# Patient Record
Sex: Male | Born: 2000
Health system: Southern US, Community
[De-identification: ages and names within clinical notes are randomized; demographics above are authoritative.]

## PROBLEM LIST (undated history)

## (undated) DIAGNOSIS — J45909 Unspecified asthma, uncomplicated: Secondary | ICD-10-CM

## (undated) DIAGNOSIS — N186 End stage renal disease: Secondary | ICD-10-CM

## (undated) DIAGNOSIS — N049 Nephrotic syndrome with unspecified morphologic changes: Secondary | ICD-10-CM

## (undated) DIAGNOSIS — N289 Disorder of kidney and ureter, unspecified: Secondary | ICD-10-CM

## (undated) HISTORY — PX: RENAL BIOPSY: SHX156

---
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
## 2019-01-19 MED ORDER — GENERIC EXTERNAL MEDICATION
60.00 | Status: DC
Start: 2019-01-20 — End: 2019-01-19

## 2019-08-20 MED ORDER — MYCOPHENOLATE MOFETIL 250 MG PO CAPS
1000.00 | ORAL_CAPSULE | ORAL | Status: DC
Start: 2019-08-20 — End: 2019-08-20

## 2019-08-20 MED ORDER — CHOLECALCIFEROL 25 MCG (1000 UT) PO TABS
4000.00 | ORAL_TABLET | ORAL | Status: DC
Start: 2019-08-21 — End: 2019-08-20

## 2019-08-20 MED ORDER — AMLODIPINE BESYLATE 5 MG PO TABS
5.00 | ORAL_TABLET | ORAL | Status: DC
Start: 2019-08-21 — End: 2019-08-20

## 2019-08-20 MED ORDER — ISRADIPINE 2.5 MG PO CAPS
2.50 | ORAL_CAPSULE | ORAL | Status: DC
Start: ? — End: 2019-08-20

## 2019-08-20 MED ORDER — PREDNISONE 20 MG PO TABS
60.00 | ORAL_TABLET | ORAL | Status: DC
Start: 2019-08-21 — End: 2019-08-20

## 2020-01-25 MED ORDER — FAMOTIDINE 20 MG PO TABS
20.00 | ORAL_TABLET | ORAL | Status: DC
Start: 2020-01-25 — End: 2020-01-25

## 2020-01-25 MED ORDER — ERGOCALCIFEROL 1.25 MG (50000 UT) PO CAPS
50000.00 | ORAL_CAPSULE | ORAL | Status: DC
Start: 2020-01-28 — End: 2020-01-25

## 2020-01-25 MED ORDER — PREDNISONE 20 MG PO TABS
60.00 | ORAL_TABLET | ORAL | Status: DC
Start: 2020-01-26 — End: 2020-01-25

## 2020-01-25 MED ORDER — MYCOPHENOLATE MOFETIL 250 MG PO CAPS
1000.00 | ORAL_CAPSULE | ORAL | Status: DC
Start: 2020-01-25 — End: 2020-01-25

## 2020-05-24 ENCOUNTER — Encounter: Payer: Self-pay | Admitting: Pediatrics

## 2020-08-21 ENCOUNTER — Ambulatory Visit: Admit: 2020-08-21 | Discharge status: Against Medical Advice | Payer: Self-pay

## 2020-10-27 ENCOUNTER — Encounter (HOSPITAL_COMMUNITY): Payer: Self-pay

## 2020-10-27 ENCOUNTER — Emergency Department (HOSPITAL_COMMUNITY)
Admission: EM | Admit: 2020-10-27 | Discharge: 2020-10-27 | Disposition: A | Discharge status: Home / Self Care | Payer: Medicaid Other | Attending: Emergency Medicine | Admitting: Emergency Medicine

## 2020-10-27 ENCOUNTER — Other Ambulatory Visit: Payer: Self-pay

## 2020-10-27 DIAGNOSIS — H66002 Acute suppurative otitis media without spontaneous rupture of ear drum, left ear: Secondary | ICD-10-CM | POA: Diagnosis not present

## 2020-10-27 DIAGNOSIS — Z7722 Contact with and (suspected) exposure to environmental tobacco smoke (acute) (chronic): Secondary | ICD-10-CM | POA: Diagnosis not present

## 2020-10-27 DIAGNOSIS — Z79899 Other long term (current) drug therapy: Secondary | ICD-10-CM | POA: Insufficient documentation

## 2020-10-27 DIAGNOSIS — J45909 Unspecified asthma, uncomplicated: Secondary | ICD-10-CM | POA: Insufficient documentation

## 2020-10-27 DIAGNOSIS — R0981 Nasal congestion: Secondary | ICD-10-CM | POA: Insufficient documentation

## 2020-10-27 DIAGNOSIS — H9201 Otalgia, right ear: Secondary | ICD-10-CM | POA: Diagnosis present

## 2020-10-27 MED ORDER — AMOXICILLIN 500 MG PO CAPS
500.0000 mg | ORAL_CAPSULE | Freq: Once | ORAL | Status: AC
  Administered 2020-10-27: 500 mg via ORAL
  Filled 2020-10-27: qty 1

## 2020-10-27 MED ORDER — AMOXICILLIN 500 MG PO CAPS: 500.0000 mg | ORAL_CAPSULE | Freq: Three times a day (TID) | ORAL | 0 refills | Status: DC

## 2020-10-27 NOTE — ED Triage Notes (Signed)
Pt reports left ear pain that radiates to jaw that started 2 days ago. Pt also reports nasal congestion.
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
## 2022-10-17 ENCOUNTER — Emergency Department (HOSPITAL_COMMUNITY): Payer: Medicaid Other

## 2022-10-17 ENCOUNTER — Emergency Department (HOSPITAL_COMMUNITY)
Admission: EM | Admit: 2022-10-17 | Discharge: 2022-10-17 | Disposition: A | Discharge status: Home / Self Care | Payer: Medicaid Other | Attending: Emergency Medicine | Admitting: Emergency Medicine

## 2022-10-17 ENCOUNTER — Other Ambulatory Visit: Payer: Self-pay

## 2022-10-17 ENCOUNTER — Encounter (HOSPITAL_COMMUNITY): Payer: Self-pay

## 2022-10-17 DIAGNOSIS — S79922A Unspecified injury of left thigh, initial encounter: Secondary | ICD-10-CM | POA: Diagnosis present

## 2022-10-17 DIAGNOSIS — D72829 Elevated white blood cell count, unspecified: Secondary | ICD-10-CM | POA: Insufficient documentation

## 2022-10-17 DIAGNOSIS — S71102A Unspecified open wound, left thigh, initial encounter: Secondary | ICD-10-CM | POA: Diagnosis not present

## 2022-10-17 DIAGNOSIS — W3400XA Accidental discharge from unspecified firearms or gun, initial encounter: Secondary | ICD-10-CM | POA: Diagnosis not present

## 2022-10-17 LAB — CBC WITH DIFFERENTIAL/PLATELET
Abs Immature Granulocytes: 0.04 10*3/uL (ref 0.00–0.07)
Basophils Absolute: 0.1 10*3/uL (ref 0.0–0.1)
Basophils Relative: 1 %
Eosinophils Absolute: 0.8 10*3/uL — ABNORMAL HIGH (ref 0.0–0.5)
Eosinophils Relative: 6 %
HCT: 35.5 % — ABNORMAL LOW (ref 39.0–52.0)
Hemoglobin: 11.7 g/dL — ABNORMAL LOW (ref 13.0–17.0)
Immature Granulocytes: 0 %
Lymphocytes Relative: 13 %
Lymphs Abs: 1.7 10*3/uL (ref 0.7–4.0)
MCH: 29.6 pg (ref 26.0–34.0)
MCHC: 33 g/dL (ref 30.0–36.0)
MCV: 89.9 fL (ref 80.0–100.0)
Monocytes Absolute: 0.9 10*3/uL (ref 0.1–1.0)
Monocytes Relative: 7 %
Neutro Abs: 9.2 10*3/uL — ABNORMAL HIGH (ref 1.7–7.7)
Neutrophils Relative %: 73 %
Platelets: 272 10*3/uL (ref 150–400)
RBC: 3.95 MIL/uL — ABNORMAL LOW (ref 4.22–5.81)
RDW: 13.2 % (ref 11.5–15.5)
WBC: 12.8 10*3/uL — ABNORMAL HIGH (ref 4.0–10.5)
nRBC: 0 % (ref 0.0–0.2)

## 2022-10-17 LAB — BASIC METABOLIC PANEL
Anion gap: 5 (ref 5–15)
BUN: 18 mg/dL (ref 6–20)
CO2: 23 mmol/L (ref 22–32)
Calcium: 7.8 mg/dL — ABNORMAL LOW (ref 8.9–10.3)
Chloride: 114 mmol/L — ABNORMAL HIGH (ref 98–111)
Creatinine, Ser: 2.22 mg/dL — ABNORMAL HIGH (ref 0.61–1.24)
GFR, Estimated: 42 mL/min — ABNORMAL LOW (ref >60)
Glucose, Bld: 92 mg/dL (ref 70–99)
Potassium: 3.9 mmol/L (ref 3.5–5.1)
Sodium: 142 mmol/L (ref 135–145)

## 2022-10-17 MED ORDER — OXYCODONE-ACETAMINOPHEN 5-325 MG PO TABS: 1.0000 | ORAL_TABLET | Freq: Four times a day (QID) | ORAL | 0 refills | Status: AC | PRN

## 2022-10-17 MED ORDER — CEPHALEXIN 500 MG PO CAPS: 500.0000 mg | ORAL_CAPSULE | Freq: Two times a day (BID) | ORAL | 0 refills | Status: DC

## 2022-10-17 MED ORDER — OXYCODONE-ACETAMINOPHEN 5-325 MG PO TABS
1.0000 | ORAL_TABLET | Freq: Once | ORAL | Status: AC
  Administered 2022-10-17: 1 via ORAL
  Filled 2022-10-17: qty 1

## 2022-10-17 MED ORDER — CEFAZOLIN SODIUM-DEXTROSE 1-4 GM/50ML-% IV SOLN
1.0000 g | Freq: Once | INTRAVENOUS | Status: AC
  Administered 2022-10-17: 1 g via INTRAVENOUS
  Filled 2022-10-17: qty 50
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
## 2022-10-17 NOTE — Progress Notes (Signed)
Orthopedic Tech Progress Note Patient Details:  [name] [birth year] [id number]  Patient ID: [name], male   DOB: 2001-05-27, 21 y.o.   MRN: [id number] Viewing chart regarding ortho tech orders. [name] 10/17/2022, 10:31 PM
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
## 2023-08-24 ENCOUNTER — Other Ambulatory Visit (HOSPITAL_COMMUNITY): Payer: Self-pay | Admitting: *Deleted

## 2023-08-24 DIAGNOSIS — D649 Anemia, unspecified: Secondary | ICD-10-CM

## 2023-08-28 ENCOUNTER — Inpatient Hospital Stay (HOSPITAL_COMMUNITY): Admission: RE | Admit: 2023-08-28 | Payer: Medicaid Other | Source: Ambulatory Visit

## 2023-09-06 ENCOUNTER — Inpatient Hospital Stay (HOSPITAL_COMMUNITY): Admission: RE | Admit: 2023-09-06 | Payer: Self-pay | Source: Ambulatory Visit

## 2023-10-26 ENCOUNTER — Other Ambulatory Visit: Payer: Self-pay | Admitting: *Deleted

## 2023-10-26 DIAGNOSIS — N186 End stage renal disease: Secondary | ICD-10-CM

## 2023-11-01 ENCOUNTER — Ambulatory Visit (HOSPITAL_COMMUNITY)
Admission: RE | Admit: 2023-11-01 | Discharge: 2023-11-01 | Disposition: A | Discharge status: Home / Self Care | Payer: 59 | Source: Ambulatory Visit | Attending: Vascular Surgery | Admitting: Vascular Surgery

## 2023-11-01 ENCOUNTER — Ambulatory Visit (INDEPENDENT_AMBULATORY_CARE_PROVIDER_SITE_OTHER)
Admission: RE | Admit: 2023-11-01 | Discharge: 2023-11-01 | Disposition: A | Discharge status: Home / Self Care | Payer: Self-pay | Source: Ambulatory Visit | Attending: Vascular Surgery | Admitting: Vascular Surgery

## 2023-11-01 DIAGNOSIS — N186 End stage renal disease: Secondary | ICD-10-CM | POA: Insufficient documentation
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
## 2023-11-06 ENCOUNTER — Encounter: Payer: Self-pay | Admitting: Vascular Surgery
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
## 2023-12-13 ENCOUNTER — Encounter: Payer: Self-pay | Admitting: Vascular Surgery

## 2024-01-04 ENCOUNTER — Other Ambulatory Visit: Payer: Self-pay | Admitting: *Deleted

## 2024-01-04 DIAGNOSIS — N186 End stage renal disease: Secondary | ICD-10-CM

## 2024-01-10 ENCOUNTER — Ambulatory Visit (HOSPITAL_COMMUNITY)
Admission: RE | Admit: 2024-01-10 | Discharge: 2024-01-10 | Disposition: A | Discharge status: Home / Self Care | Payer: Self-pay | Source: Ambulatory Visit | Attending: Vascular Surgery | Admitting: Vascular Surgery

## 2024-01-10 ENCOUNTER — Encounter (HOSPITAL_COMMUNITY): Payer: Self-pay

## 2024-01-10 ENCOUNTER — Ambulatory Visit (HOSPITAL_COMMUNITY)
Admission: RE | Admit: 2024-01-10 | Discharge: 2024-01-10 | Disposition: A | Discharge status: Home / Self Care | Payer: Medicaid Other | Source: Ambulatory Visit | Attending: Vascular Surgery | Admitting: Vascular Surgery

## 2024-01-10 DIAGNOSIS — N186 End stage renal disease: Secondary | ICD-10-CM
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
## 2024-01-24 ENCOUNTER — Encounter: Payer: Self-pay | Admitting: Vascular Surgery

## 2024-01-24 ENCOUNTER — Ambulatory Visit: Payer: Medicaid Other | Admitting: Vascular Surgery

## 2024-01-24 VITALS — BP 151/103 | Temp 98.8°F | Resp 20 | Ht 72.0 in | Wt 136.0 lb

## 2024-01-24 DIAGNOSIS — Z992 Dependence on renal dialysis: Secondary | ICD-10-CM | POA: Diagnosis not present

## 2024-01-24 DIAGNOSIS — N186 End stage renal disease: Secondary | ICD-10-CM | POA: Diagnosis not present

## 2024-04-26 ENCOUNTER — Encounter (HOSPITAL_COMMUNITY): Payer: Self-pay

## 2024-04-26 ENCOUNTER — Other Ambulatory Visit: Payer: Self-pay

## 2024-04-26 ENCOUNTER — Emergency Department (HOSPITAL_COMMUNITY)
Admission: EM | Admit: 2024-04-26 | Discharge: 2024-04-26 | Disposition: A | Discharge status: Home / Self Care | Attending: Emergency Medicine | Admitting: Emergency Medicine

## 2024-04-26 DIAGNOSIS — N186 End stage renal disease: Secondary | ICD-10-CM | POA: Diagnosis present

## 2024-04-26 DIAGNOSIS — Z992 Dependence on renal dialysis: Secondary | ICD-10-CM | POA: Diagnosis not present

## 2024-04-26 HISTORY — DX: End stage renal disease: N18.6

## 2024-04-26 LAB — BASIC METABOLIC PANEL WITH GFR
Anion gap: 15 (ref 5–15)
BUN: 43 mg/dL — ABNORMAL HIGH (ref 6–20)
CO2: 22 mmol/L (ref 22–32)
Calcium: 8.9 mg/dL (ref 8.9–10.3)
Chloride: 102 mmol/L (ref 98–111)
Creatinine, Ser: 15.47 mg/dL — ABNORMAL HIGH (ref 0.61–1.24)
GFR, Estimated: 4 mL/min — ABNORMAL LOW (ref >60)
Glucose, Bld: 74 mg/dL (ref 70–99)
Potassium: 4.4 mmol/L (ref 3.5–5.1)
Sodium: 139 mmol/L (ref 135–145)

## 2024-04-26 LAB — CBC
HCT: 36.3 % — ABNORMAL LOW (ref 39.0–52.0)
Hemoglobin: 12.5 g/dL — ABNORMAL LOW (ref 13.0–17.0)
MCH: 31.4 pg (ref 26.0–34.0)
MCHC: 34.4 g/dL (ref 30.0–36.0)
MCV: 91.2 fL (ref 80.0–100.0)
Platelets: 169 10*3/uL (ref 150–400)
RBC: 3.98 MIL/uL — ABNORMAL LOW (ref 4.22–5.81)
RDW: 14.6 % (ref 11.5–15.5)
WBC: 4.5 10*3/uL (ref 4.0–10.5)
nRBC: 0 % (ref 0.0–0.2)

## 2024-04-26 LAB — HEPATITIS B SURFACE ANTIGEN: Hepatitis B Surface Ag: NONREACTIVE

## 2024-04-26 MED ORDER — ALTEPLASE 2 MG IJ SOLR: 2.0000 mg | Freq: Once | INTRAMUSCULAR | Status: DC | PRN

## 2024-04-26 MED ORDER — PENTAFLUOROPROP-TETRAFLUOROETH EX AERO: 1.0000 | INHALATION_SPRAY | CUTANEOUS | Status: DC | PRN

## 2024-04-26 MED ORDER — HEPARIN SODIUM (PORCINE) 1000 UNIT/ML DIALYSIS: 2000.0000 [IU] | INTRAMUSCULAR | Status: DC | PRN

## 2024-04-26 MED ORDER — CHLORHEXIDINE GLUCONATE CLOTH 2 % EX PADS: 6.0000 | MEDICATED_PAD | Freq: Every day | CUTANEOUS | Status: DC

## 2024-04-26 MED ORDER — NEPRO/CARBSTEADY PO LIQD
237.0000 mL | ORAL | Status: DC | PRN
  Filled 2024-04-26: qty 237

## 2024-04-26 MED ORDER — ANTICOAGULANT SODIUM CITRATE 4% (200MG/5ML) IV SOLN
5.0000 mL | Status: DC | PRN
  Filled 2024-04-26: qty 5

## 2024-04-26 MED ORDER — LIDOCAINE HCL (PF) 1 % IJ SOLN: 5.0000 mL | INTRAMUSCULAR | Status: DC | PRN

## 2024-04-26 MED ORDER — LIDOCAINE-PRILOCAINE 2.5-2.5 % EX CREA: 1.0000 | TOPICAL_CREAM | CUTANEOUS | Status: DC | PRN

## 2024-04-26 MED ORDER — HEPARIN SODIUM (PORCINE) 1000 UNIT/ML DIALYSIS: 1000.0000 [IU] | INTRAMUSCULAR | Status: DC | PRN

## 2024-04-26 MED ORDER — HEPARIN SODIUM (PORCINE) 1000 UNIT/ML DIALYSIS: 2000.0000 [IU] | Freq: Once | INTRAMUSCULAR | Status: DC

## 2024-04-26 NOTE — Progress Notes (Signed)
 Asked to see this patient for hospital dialysis.  He started dialysis in the outpatient setting about 1 month ago at [facility] HD clinic on TTS schedule.  He is now incarcerated and needs hospital dialysis.  He had dialysis on 5/08, 2 days ago, at his usual dialysis clinic.  He does not know the cause of his kidney failure.  They usually pull from 2.5 to 3 L during each session.  He denies any shortness of breath, cough or chest pain.  Has a tunneled catheter in his right upper chest.  Exam is otherwise unremarkable, no lower extremity edema.  OP HD orders from 04/26/24 --> 4h  B400  56.2kg   2K bath  RIJ TDC  Heparin 2000  The plan will be for "ED HD". Pt is not to be admitted at this time. Pt will go to the dialysis unit when they are ready for the patient. When dialysis is completed pt will be sent back to ED for reassessment.   Larry Poag  MD  [facility] 04/26/2024, 11:04 AM  Recent Labs  Lab 04/26/24 1027  HGB 12.5*    Inpatient medications:

## 2024-04-26 NOTE — ED Provider Notes (Signed)
 [place] EMERGENCY DEPARTMENT AT [facility] Provider Note   CSN: [id number] Arrival date & time: 04/26/24  0957     History  Chief Complaint  Patient presents with   Needs dialysis    [name] is a 23 y.o. male history of ESRD on dialysis presented for his Monday Wednesday Saturday dialysis.  Patient has no acute concerns at this time and denies chest pain shortness of breath feeling weak or confusion.  Home Medications Prior to Admission medications   Medication Sig Start Date End Date Taking? Authorizing Provider  albuterol  (PROVENTIL  HFA;VENTOLIN  HFA) 108 (90 BASE) MCG/ACT inhaler Inhale 2 puffs into the lungs every 4 (four) hours as needed for wheezing (use with home spacer). 11/08/15   Lavonda Pour, MD  allopurinol  (ZYLOPRIM ) 100 MG tablet Take 100 mg by mouth daily.    [provider]  amLODipine  (NORVASC ) 5 MG tablet Take 1 tablet (5 mg total) by mouth daily. 08/10/16   Georganna Kin, PA-C  calcium  carbonate (OS-CAL - DOSED IN MG OF ELEMENTAL CALCIUM ) 1250 (500 Ca) MG tablet Take 1 tablet (500 mg of elemental calcium  total) by mouth 2 (two) times daily with a meal. 08/10/16   Georganna Kin, PA-C  Cholecalciferol  (D 2000) 2000 UNITS TABS Take 4,000 Units by mouth daily. 09/07/13   [provider]  cycloSPORINE  modified (NEORAL ) 100 MG capsule Take 1 capsule (100 mg total) twice a day for a total of 150mg  in the morning and 125mg  at night. 10/21/15   Lavonda Pour, MD  cycloSPORINE  modified (NEORAL ) 100 MG capsule Take 100 mg by mouth at bedtime.    [provider]  cycloSPORINE  modified (NEORAL ) 25 MG capsule Take two capsules (50mg ) twice a day  for a total of 150 mg,  10/21/15   [provider]  cycloSPORINE  modified (NEORAL ) 25 MG capsule Take 125 mg by mouth every morning.    [provider]  furosemide  (LASIX ) 80 MG tablet Take 1 tablet (80 mg total) by mouth daily. 08/10/16   Jeffery, Michael J,  PA-C  oxyCODONE -acetaminophen  (PERCOCET/ROXICET) 5-325 MG tablet Take 1 tablet by mouth every 6 (six) hours as needed for severe pain. 10/17/22   Tonya Fredrickson, MD  predniSONE  (DELTASONE ) 20 MG tablet Take 1 tablet (20 mg total) by mouth 3 (three) times daily with meals. 08/10/16   Georganna Kin, PA-C  ranitidine  (ZANTAC  75) 75 MG tablet Take 75 mg by mouth daily. 03/07/14 03/07/15  [provider]      Allergies    Grapefruit extract    Review of Systems   Review of Systems  Physical Exam Updated Vital Signs BP (!) 155/112 (BP Location: Right Arm)   Pulse (!) 57   Temp 98.1 F (36.7 C) (Oral)   Resp 18   Ht 6' (1.829 m)   Wt 63.5 kg   SpO2 97%   BMI 18.99 kg/m  Physical Exam Vitals reviewed.  Constitutional:      General: He is not in acute distress. HENT:     Head: Normocephalic and atraumatic.  Eyes:     Extraocular Movements: Extraocular movements intact.     Conjunctiva/sclera: Conjunctivae normal.     Pupils: Pupils are equal, round, and reactive to light.  Cardiovascular:     Rate and Rhythm: Normal rate and regular rhythm.     Pulses: Normal pulses.     Heart sounds: Normal heart sounds.     Comments: 2+ bilateral radial/dorsalis pedis pulses  with regular rate Pulmonary:     Effort: Pulmonary effort is normal. No respiratory distress.     Breath sounds: Normal breath sounds.  Abdominal:     Palpations: Abdomen is soft.     Tenderness: There is no abdominal tenderness. There is no guarding or rebound.  Musculoskeletal:        General: Normal range of motion.     Cervical back: Normal range of motion and neck supple.     Comments: 5 out of 5 bilateral grip/leg extension strength  Skin:    General: Skin is warm and dry.     Capillary Refill: Capillary refill takes less than 2 seconds.  Neurological:     General: No focal deficit present.     Mental Status: He is alert and oriented to person, place, and time.     Comments: Sensation intact in  all 4 limbs  Psychiatric:        Mood and Affect: Mood normal.     ED Results / Procedures / Treatments   Labs (all labs ordered are listed, but only abnormal results are displayed) Labs Reviewed  CBC  BASIC METABOLIC PANEL WITH GFR    EKG None  Radiology No results found.  Procedures Procedures    Medications Ordered in ED Medications - No data to display  ED Course/ Medical Decision Making/ A&P                                 Medical Decision Making Amount and/or Complexity of Data Reviewed Labs: ordered.  Risk Decision regarding hospitalization.   [name] 23 y.o. presented today for dialysis. Working DDx that I considered at this time includes, but not limited to, electrolyte abnormalities, arrhythmias, emergent dialysis, dehydration, fluid overload, pleural effusions, uremic encephalopathy.  R/o DDx: arrhythmias, emergent dialysis, dehydration, fluid overload, pleural effusions, uremic encephalopathy: These are considered less likely due to history of present illness, physical exam, labs/imaging findings.  Review of prior external notes: 10/24/23 clinical support  Unique Tests and My Independent Interpretation:  CBC: pending BMP: pending  Social Determinants of Health: dialysis  Discussion with Independent Historian: None  Discussion of Management of Tests: Schertz, MD Nephrology  Risk: High: hospitalization or escalation of hospital-level care  Risk Stratification Score: none  Plan: On exam patient was no acute distress with stable vitals.  Patient has no complaints at this time states he is only here for dialysis.  Will draw labs consult nephrology.  Contacted nephrology and they agree with dialysis for the patient. Will await for Nephrology to transport patient for dialysis and anticipate discharge afterwards.  This chart was dictated using voice recognition software.  Despite best efforts to proofread,  errors can occur which can  change the documentation meaning.        Final Clinical Impression(s) / ED Diagnoses Final diagnoses:  ESRD needing dialysis [facility])    Rx / DC Orders ED Discharge Orders     None         [name] 04/26/24 1052    Roberto Carte, MD 04/27/24 [phone]

## 2024-04-26 NOTE — Procedures (Addendum)
 I was present at the procedure, reviewed the HD regimen and made appropriate changes.  Pt in no distress.  Larry Poag MD  [facility] 04/26/2024, 11:03 PM

## 2024-04-26 NOTE — ED Provider Notes (Signed)
  Physical Exam  BP (!) 150/127 (BP Location: Right Arm)   Pulse 93   Temp 98.8 F (37.1 C)   Resp 16   Ht 6' (1.829 m)   Wt 63.5 kg   SpO2 99%   BMI 18.99 kg/m   Physical Exam  Procedures  Procedures  ED Course / MDM   Clinical Course as of 04/26/24 2059  Sat Apr 26, 2024  2059 Patient has returned from dialysis.  Is feeling much better.  No complaints.  Lungs are clear to auscultation bilaterally.  Discharged in police custody. [RP]    Clinical Course User Index [RP] Ninetta Basket, MD   Medical Decision Making Amount and/or Complexity of Data Reviewed Labs: ordered.  Risk Decision regarding hospitalization.      Ninetta Basket, MD 04/26/24 2059

## 2024-04-26 NOTE — ED Triage Notes (Signed)
 PT BIB [facility] from [facility] needing regularly scheduled dialysis. PT denies pain, dizziness or weakness, Aox4. Reg dialysis schedule is Mon, Wed, Sat per PT.

## 2024-04-29 ENCOUNTER — Other Ambulatory Visit (HOSPITAL_COMMUNITY): Payer: Self-pay | Admitting: Nephrology

## 2024-05-02 LAB — HEPATITIS B SURFACE ANTIBODY, QUANTITATIVE: Hep B S AB Quant (Post): 3.6 m[IU]/mL — ABNORMAL LOW

## 2024-05-27 ENCOUNTER — Telehealth (HOSPITAL_COMMUNITY): Payer: Self-pay | Admitting: *Deleted

## 2024-05-27 NOTE — Telephone Encounter (Signed)
 Received fax from Dr Leandra Pro requesting permanent access evaluation. Pt seen by JER 01/24/2024 with plans discussing Left radiocephalic AVF.  Per JER, ok to send to surgery scheduling. Will give to [facility] and scan fax into media.

## 2024-05-28 ENCOUNTER — Telehealth: Payer: Self-pay

## 2024-05-28 NOTE — Telephone Encounter (Signed)
 Attempted to call for surgery scheduling. LVM

## 2024-06-06 ENCOUNTER — Telehealth: Payer: Self-pay

## 2024-06-06 NOTE — Telephone Encounter (Signed)
 Attempted to call for surgery scheduling. LVM

## 2024-06-11 ENCOUNTER — Telehealth: Payer: Self-pay

## 2024-06-11 NOTE — Telephone Encounter (Signed)
 Patient has not returned calls to office for scheduling of LUE AVF creation.  Letter sent.

## 2024-12-05 ENCOUNTER — Other Ambulatory Visit: Payer: Self-pay | Admitting: *Deleted

## 2024-12-05 DIAGNOSIS — N186 End stage renal disease: Secondary | ICD-10-CM

## 2025-01-01 ENCOUNTER — Ambulatory Visit (HOSPITAL_COMMUNITY)

## 2025-01-01 ENCOUNTER — Ambulatory Visit (HOSPITAL_COMMUNITY): Admitting: Vascular Surgery

## 2025-02-04 ENCOUNTER — Ambulatory Visit (HOSPITAL_COMMUNITY)

## 2025-02-06 ENCOUNTER — Ambulatory Visit: Admitting: Vascular Surgery

## 2025-02-13 ENCOUNTER — Ambulatory Visit: Admitting: Vascular Surgery
